# Patient Record
Sex: Male | Born: 1990 | Race: Black or African American | Hispanic: No | Marital: Single | Smoking: Never smoker
Health system: Southern US, Community
[De-identification: ages and names within clinical notes are randomized; demographics above are authoritative.]

## PROBLEM LIST (undated history)

## (undated) HISTORY — PX: APPENDECTOMY: SHX54

---

## 2015-06-11 ENCOUNTER — Emergency Department (HOSPITAL_COMMUNITY)
Admission: EM | Admit: 2015-06-11 | Discharge: 2015-06-11 | Disposition: A | Discharge status: Home / Self Care | Payer: Self-pay | Attending: Emergency Medicine | Admitting: Emergency Medicine

## 2015-06-11 ENCOUNTER — Encounter (HOSPITAL_COMMUNITY): Payer: Self-pay

## 2015-06-11 ENCOUNTER — Emergency Department (HOSPITAL_COMMUNITY): Payer: Self-pay

## 2015-06-11 DIAGNOSIS — R079 Chest pain, unspecified: Secondary | ICD-10-CM

## 2015-06-11 DIAGNOSIS — R0789 Other chest pain: Secondary | ICD-10-CM | POA: Insufficient documentation

## 2015-06-11 MED ORDER — NAPROXEN 500 MG PO TABS
500.0000 mg | ORAL_TABLET | Freq: Once | ORAL | Status: AC
  Administered 2015-06-11: 500 mg via ORAL
  Filled 2015-06-11: qty 1

## 2015-06-11 MED ORDER — NAPROXEN 500 MG PO TABS: 500.0000 mg | ORAL_TABLET | Freq: Two times a day (BID) | ORAL | Status: AC

## 2015-06-11 NOTE — ED Notes (Signed)
Pt fell off a roof two months ago but has been ok since then

## 2015-06-11 NOTE — ED Notes (Signed)
Pt complains of generalized weakness and a headache for two days, today he states that he has a stabbing pain in his chest

## 2015-06-11 NOTE — Discharge Instructions (Signed)
Be sure to drink plenty of fluids to prevent dehydration. Take naproxen as prescribed for pain. You may try icing areas of pain to improve inflammation/swelling which may be causing your pain. Follow-up with a primary care provider for further evaluation of your symptoms.  Chest Wall Pain Chest wall pain is pain in or around the bones and muscles of your chest. It may take up to 6 weeks to get better. It may take longer if you must stay physically active in your work and activities.  CAUSES  Chest wall pain may happen on its own. However, it may be caused by:  A viral illness like the flu.  Injury.  Coughing.  Exercise.  Arthritis.  Fibromyalgia.  Shingles. HOME CARE INSTRUCTIONS   Avoid overtiring physical activity. Try not to strain or perform activities that cause pain. This includes any activities using your chest or your abdominal and side muscles, especially if heavy weights are used.  Put ice on the sore area.  Put ice in a plastic bag.  Place a towel between your skin and the bag.  Leave the ice on for 15-20 minutes per hour while awake for the first 2 days.  Only take over-the-counter or prescription medicines for pain, discomfort, or fever as directed by your caregiver. SEEK IMMEDIATE MEDICAL CARE IF:   Your pain increases, or you are very uncomfortable.  You have a fever.  Your chest pain becomes worse.  You have new, unexplained symptoms.  You have nausea or vomiting.  You feel sweaty or lightheaded.  You have a cough with phlegm (sputum), or you cough up blood. MAKE SURE YOU:   Understand these instructions.  Will watch your condition.  Will get help right away if you are not doing well or get worse. Document Released: 11/21/2005 Document Revised: 02/13/2012 Document Reviewed: 07/18/2011 [facility] Patient Information 2015 [place], [place]. This information is not intended to replace advice given to you by your health care provider. Make sure you  discuss any questions you have with your health care provider.   Emergency Department Resource Guide 1) Find a Doctor and Pay Out of Pocket Although you won't have to find out who is covered by your insurance plan, it is a good idea to ask around and get recommendations. You will then need to call the office and see if the doctor you have chosen will accept you as a new patient and what types of options they offer for patients who are self-pay. Some doctors offer discounts or will set up payment plans for their patients who do not have insurance, but you will need to ask so you aren't surprised when you get to your appointment.  2) Contact Your Local Health Department Not all health departments have doctors that can see patients for sick visits, but many do, so it is worth a call to see if yours does. If you don't know where your local health department is, you can check in your phone book. The CDC also has a tool to help you locate your state's health department, and many state websites also have listings of all of their local health departments.  3) Find a Walk-in Clinic If your illness is not likely to be very severe or complicated, you may want to try a walk in clinic. These are popping up all over the country in pharmacies, drugstores, and shopping centers. They're usually staffed by nurse practitioners or physician assistants that have been trained to treat common illnesses and complaints. They're usually  fairly quick and inexpensive. However, if you have serious medical issues or chronic medical problems, these are probably not your best option.  No Primary Care Doctor: - Call Health Connect at  [phone] - they can help you locate a primary care doctor that  accepts your insurance, provides certain services, etc. - Physician Referral Service- [phone]  Chronic Pain Problems: Organization         Address  Phone   Notes  [facility]  [phone] Patients need to  be referred by their primary care doctor.   Medication Assistance: Organization         Address  Phone   Notes  [facility] Medication [facility] [address] [place] [place], [place] [address] [phone] --Must be a resident of [facility] -- Must have NO insurance coverage whatsoever (no Medicaid/ Medicare, etc.) -- The pt. MUST have a primary care doctor that directs their care regularly and follows them in the community   [facility]  [phone]   [employer]  [phone]    Agencies that provide inexpensive medical care: Organization         Address  Phone   Notes  [facility]  [phone]   [facility]    [phone]   [facility] [address] [place], [place] [address] [phone]   [facility] [address] [place] [phone]   Planned Parenthood    [phone]   [facility]    [phone]   [facility]  [address], [place] Phone:  [phone], Fax:  [phone] Hours of Operation:  9 am - 6 pm, M-F.  Also accepts Medicaid/Medicare and self-pay.  [facility]  [address], [place] Phone: [phone], Fax: [phone]. Hours of Operation:  8:30 am - 5:30 pm, M-F.  Also accepts Medicaid and self-pay.  [facility] [place] [address] [place] Phone: [phone]   [facility] [address] Natasha Bence [place], [place] [phone], Ext. [phone] Mondays & Thursdays: 7-9 AM.  First 15 patients are seen on a first come, first serve basis.    Medicaid-accepting [facility] Providers:  Organization         Address  Phone   Notes  [facility] [address], [place] [phone] Also accepts self-pay patients.  [facility] [address] Laurell Josephs [place], [place]  [phone]   [facility] [address], [place] [phone]   [facility] [address] [place] [phone]   Renaye Rakers [address], [place]   [phone] Only accepts [place] patients after they have their name applied to their card.   Self-Pay (no insurance) in [facility]:  Organization         Address  Phone   Notes  Sickle Cell Patients, [facility] [address] [place], [place] [phone]   [facility] [address] [place], [place] [phone]   [facility] [place]  [address], [place] [phone]   [facility]/Dr. Osei-Bonsu  [address] [place] or [address], [place] [phone] Phone  number for both [place] and [place] locations is the same.  [facility] [address], [place] [phone]   [facility] [place] [address] [place] [address], [place] or [address] [phone]   [facility] [address], [place] [phone], phone; [phone], fax Sees patients 1st and 3rd Saturday of every month.  Must not qualify for public or private insurance (i.e. Medicaid, Medicare, [place] Health Choice, Veterans' Benefits)  Household income should be no more than 200% of the poverty level The clinic cannot treat you if you are pregnant or think you are pregnant  Sexually transmitted diseases are not treated at the clinic.    Dental Care: Organization         Address  Phone  Notes  [facility] [address], [place] [phone] Accepts children up to age 24 who are enrolled in [place] or [place] Health Choice; pregnant women with a Medicaid card; and children who have applied for Medicaid or [place] Health Choice, but were declined, whose parents can  pay a reduced fee at time of service.  [facility] [place]  [address], [place] [phone] Accepts children up to age 24 who are enrolled in [place] or [place] Health Choice; pregnant women with a Medicaid card; and children who have applied for Medicaid or [place] Health Choice, but were declined, whose parents can pay a reduced fee at time of service.  [facility]  [address], [place] [phone] Patients are seen by appointment only. Walk-ins are not accepted. [facility] will see patients 24 years of age and older. Monday - Tuesday (8am-5pm) Most Wednesdays (8:30-5pm) $30 per visit, cash only  [facility]  [address], [facility] [phone] Patients are seen by appointment only. Walk-ins are not accepted. [facility] will see patients 24 years of age and older. One Wednesday Evening (Monthly: Volunteer Based).  $30 per visit, cash only  [employer] [facility] [facility]  [phone] for adults; Children under age 134, call Graduate Pediatric Dentistry at [phone]. Children aged 124-14, please call [phone] to request a pediatric application.  Dental services are provided in all areas of dental care including fillings, crowns and bridges, complete and partial dentures, implants, gum treatment, root canals, and extractions. Preventive care is also provided. Treatment is provided to both adults and children. Patients are selected via a lottery and there is often a waiting list.   [facility] [address], [place]  [phone] [email or web address]   [facility] [address], [place], [place] [phone], Ext. [phone] Second and Fourth Thursday of each month, opens at 6:30 AM; Clinic ends at 9 AM.  Patients are seen on a first-come first-served basis, and a limited number are seen during each clinic.   [facility]  [address], [place], [place] [phone]   Eligibility Requirements You must have lived in [place], [place], or [place] counties for at least the last three months.   You cannot be eligible for state or federal sponsored [place]healthcare insurance, including CIGNAVeterans Administration, [place]icaid, or [employer]icare.   You generally cannot be eligible for healthcare insurance through your employer.    How to apply: Eligibility screenings are held every Tuesday and Wednesday afternoon from 1:00 pm  until 4:00 pm. You do not need an appointment for the interview!  [facility] [address], [place], [place] [phone]   [facility] Health Department  [phone]   [facility]  [phone]   [facility] Health Department  [phone]

## 2015-06-11 NOTE — ED Provider Notes (Signed)
CSN: [id number]     Arrival date & time 06/11/15  0015 History   First MD Initiated Contact with Patient 06/11/15 0057     Chief Complaint  Patient presents with  . Chest Pain     (Consider location/radiation/quality/duration/timing/severity/associated sxs/prior Treatment) HPI Comments: 24 year old male presents to the emergency department for further evaluation of chest pain. Patient states that pain began at 2100 while lying down on his chest. He describes the pain as sharp and intermittent. It is aggravated with movement of his left arm as well as when lying on his chest. Patient felt the pain moved to his right low back for a few seconds, but he no longer has pain in this location. He denies taking any medication for his symptoms as well as any alleviating factors of his pain. Patient denies a family history of sudden cardiac death. He believes that he has a paternal grand uncle who had a heart attack in his 4950s to 4960s. Patient, himself, denies any known history of CAD, hypertension, hyperlipidemia, and diabetes mellitus. He has had no associated sore throat, fever, vomiting, or loss of consciousness associated with his symptoms. As an aside, patient mentions a fall off of a roof 3 months ago. He denies having any pain after the fall or any consistent symptoms associated with this trauma over the past 3 months. He denies the fall being the result of syncope; he states that he lost his balance causing him to fall off the roof. 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  Patient is a 24 y.o. male presenting with chest pain. The history is provided by the patient. No language interpreter was used.  Chest Pain   History reviewed. No pertinent past medical history. History reviewed. No pertinent past surgical history. History reviewed. No pertinent family history. History  Substance Use Topics  . Smoking status: Never Smoker    . Smokeless tobacco: Not on file  . Alcohol Use: No    Review of Systems  Cardiovascular: Positive for chest pain.  All other systems reviewed and are negative.   Allergies  Review of patient's allergies indicates no known allergies.  Home Medications   Prior to Admission medications   Medication Sig Start Date End Date Taking? Authorizing Provider  Aspirin-Acetaminophen-Caffeine 260-130-16 MG TABS Take 1 packet by mouth every 4 (four) hours as needed (pain).   Yes Historical Provider, MD  naproxen (NAPROSYN) 500 MG tablet Take 1 tablet (500 mg total) by mouth 2 (two) times daily. 06/11/15   Antony MaduraKelly Edge Mauger, PA-C   BP 130/73 mmHg  Pulse 67  Temp(Src) 97.9 F (36.6 C) (Oral)  Resp 18  Ht 5\' 11"  (1.803 m)  Wt 226 lb (102.513 kg)  BMI 31.53 kg/m2  SpO2 99%   Physical Exam  Constitutional: He is oriented to person, place, and time. He appears well-developed and well-nourished. No distress.  Nontoxic/nonseptic appearing  HENT:  Head: Normocephalic and atraumatic.  Eyes: Conjunctivae and EOM are normal. No scleral icterus.  Neck: Normal range of motion.  Cardiovascular: Normal rate, regular rhythm and intact distal pulses.   Pulmonary/Chest: Effort normal and breath sounds normal. No respiratory distress. He has no wheezes. He has no rales. He exhibits tenderness (Tenderness to palpation to the left anterior chest wall).  Respirations even and unlabored. No bony deformity or crepitus. Chest expansion symmetric  Musculoskeletal: Normal range of motion.  Neurological: He is alert  and oriented to person, place, and time. He exhibits normal muscle tone. Coordination normal.  GCS 15. Speech is goal oriented. Patient moves extremities without ataxia.  Skin: Skin is warm and dry. No rash noted. He is not diaphoretic. No erythema. No pallor.  Psychiatric: He has a normal mood and affect. His behavior is normal.  Nursing note and vitals reviewed.   ED Course  Procedures (including  critical care time) Labs Review Labs Reviewed - No data to display  Imaging Review Dg Chest 2 View  06/11/2015   CLINICAL DATA:  Chest pain  EXAM: CHEST  2 VIEW  COMPARISON:  None.  FINDINGS: Normal heart size and mediastinal contours. No acute infiltrate or edema. No effusion or pneumothorax. No acute osseous findings.  IMPRESSION: Negative chest.   Electronically Signed   By: Marnee Spring M.D.   On: 06/11/2015 01:19    ED ECG REPORT   Date: 06/11/2015  Rate: 73  Rhythm: normal sinus rhythm  QRS Axis: normal  Intervals: normal  ST/T Wave abnormalities: nonspecific ST changes  Conduction Disutrbances:none  Narrative Interpretation: NSR with no STEMI or ischemic change. NSST changes c/w early repolarization  Old EKG Reviewed: none available  I have personally reviewed the EKG tracing and agree with the computerized printout as noted.   MDM   Final diagnoses:  Chest wall pain    24 year old male presents to the emergency department for further evaluation of chest pain. Chest x-ray shows no pneumothorax, rib fracture, focal consolidation, or pneumonia. EKG is reassuring, with no ischemic change or STEMI. Doubt ACS given age, lack of risk factors, atypical nature of symptoms, and reassuring EKG. Symptoms are reproducible on palpation making MSK etiology most likely. We will manage symptoms as outpatient with NSAIDs. Have advised icing and that patient refrain from heavy lifting as he does much of this occupationally. Return precautions provided. Patient discharged in good condition with no unaddressed concerns.   Filed Vitals:   06/11/15 0022 06/11/15 0143 06/11/15 0223  BP: 141/79 128/69 130/73  Pulse: 70 72 67  Temp: 97.9 F (36.6 C)    TempSrc: Oral    Resp: Height:  (1.803 m)    Weight: 226 lb (102.513 kg)    SpO2: 97% 99% 99%     Antony Madura, PA-C 06/11/15 0331  Loren Racer, MD 06/11/15 [phone]

## 2015-06-24 ENCOUNTER — Encounter (HOSPITAL_COMMUNITY): Payer: Self-pay | Admitting: *Deleted

## 2015-06-24 ENCOUNTER — Emergency Department (HOSPITAL_COMMUNITY): Payer: Self-pay

## 2015-06-24 ENCOUNTER — Emergency Department (HOSPITAL_COMMUNITY)
Admission: EM | Admit: 2015-06-24 | Discharge: 2015-06-25 | Disposition: A | Discharge status: Home / Self Care | Payer: Self-pay | Attending: Emergency Medicine | Admitting: Emergency Medicine

## 2015-06-24 DIAGNOSIS — R1084 Generalized abdominal pain: Secondary | ICD-10-CM | POA: Insufficient documentation

## 2015-06-24 DIAGNOSIS — R197 Diarrhea, unspecified: Secondary | ICD-10-CM | POA: Insufficient documentation

## 2015-06-24 DIAGNOSIS — R112 Nausea with vomiting, unspecified: Secondary | ICD-10-CM | POA: Insufficient documentation

## 2015-06-24 LAB — COMPREHENSIVE METABOLIC PANEL
ALK PHOS: 75 U/L (ref 38–126)
ALT: 28 U/L (ref 17–63)
AST: 20 U/L (ref 15–41)
Albumin: 4.2 g/dL (ref 3.5–5.0)
Anion gap: 7 (ref 5–15)
BUN: 16 mg/dL (ref 6–20)
CALCIUM: 9.3 mg/dL (ref 8.9–10.3)
CHLORIDE: 104 mmol/L (ref 101–111)
CO2: 28 mmol/L (ref 22–32)
Creatinine, Ser: 1.14 mg/dL (ref 0.61–1.24)
Glucose, Bld: 85 mg/dL (ref 65–99)
POTASSIUM: 3.8 mmol/L (ref 3.5–5.1)
SODIUM: 139 mmol/L (ref 135–145)
Total Bilirubin: 0.5 mg/dL (ref 0.3–1.2)
Total Protein: 8.5 g/dL — ABNORMAL HIGH (ref 6.5–8.1)

## 2015-06-24 LAB — URINALYSIS, ROUTINE W REFLEX MICROSCOPIC
BILIRUBIN URINE: NEGATIVE
Glucose, UA: NEGATIVE mg/dL
HGB URINE DIPSTICK: NEGATIVE
KETONES UR: NEGATIVE mg/dL
Leukocytes, UA: NEGATIVE
Nitrite: NEGATIVE
PH: 5.5 (ref 5.0–8.0)
PROTEIN: NEGATIVE mg/dL
Specific Gravity, Urine: 1.034 — ABNORMAL HIGH (ref 1.005–1.030)
UROBILINOGEN UA: 1 mg/dL (ref 0.0–1.0)

## 2015-06-24 LAB — CBC
HCT: 44.5 % (ref 39.0–52.0)
Hemoglobin: 15.3 g/dL (ref 13.0–17.0)
MCH: 28.1 pg (ref 26.0–34.0)
MCHC: 34.4 g/dL (ref 30.0–36.0)
MCV: 81.8 fL (ref 78.0–100.0)
Platelets: 242 10*3/uL (ref 150–400)
RBC: 5.44 MIL/uL (ref 4.22–5.81)
RDW: 12.4 % (ref 11.5–15.5)
WBC: 8.5 10*3/uL (ref 4.0–10.5)

## 2015-06-24 LAB — LIPASE, BLOOD: LIPASE: 28 U/L (ref 22–51)

## 2015-06-24 MED ORDER — ONDANSETRON HCL 4 MG/2ML IJ SOLN
4.0000 mg | Freq: Once | INTRAMUSCULAR | Status: AC
  Administered 2015-06-24: 4 mg via INTRAVENOUS
  Filled 2015-06-24: qty 2

## 2015-06-24 MED ORDER — FENTANYL CITRATE (PF) 100 MCG/2ML IJ SOLN
100.0000 ug | Freq: Once | INTRAMUSCULAR | Status: AC
Start: 2015-06-24 — End: 2015-06-24
  Administered 2015-06-24: 100 ug via INTRAVENOUS
  Filled 2015-06-24: qty 2

## 2015-06-24 NOTE — ED Notes (Signed)
Pt states he has had abdominal pain, nausea, diarrhea. emesis and left lower back pain for the past 3 days. Pt states he threw up at 9PM today after eating.

## 2015-06-25 ENCOUNTER — Encounter (HOSPITAL_COMMUNITY): Payer: Self-pay

## 2015-06-25 MED ORDER — IOHEXOL 300 MG/ML  SOLN
100.0000 mL | Freq: Once | INTRAMUSCULAR | Status: AC | PRN
  Administered 2015-06-25: 100 mL via INTRAVENOUS

## 2015-06-25 MED ORDER — HYDROCODONE-ACETAMINOPHEN 5-325 MG PO TABS: 1.0000 | ORAL_TABLET | Freq: Four times a day (QID) | ORAL | Status: AC | PRN

## 2015-06-25 MED ORDER — SODIUM CHLORIDE 0.9 % IV BOLUS (SEPSIS)
1000.0000 mL | Freq: Once | INTRAVENOUS | Status: AC
  Administered 2015-06-25: 1000 mL via INTRAVENOUS

## 2015-06-25 NOTE — ED Provider Notes (Signed)
0981 - Patient care assumed from [facility], PA-C at shift change. Patient presenting to the emergency department for further evaluation of abdominal pain. Plan discussed with lawyer, PA-C which includes discharge if CT imaging is negative. CT findings reviewed. There is no evidence of acute intra-abdominal or pelvic pathology. Will discharge with prescription for Vicodin, prescribed by prior provider. Return precautions given. Patient discharged in good condition.   Filed Vitals:   06/24/15 2215 06/25/15 0000 06/25/15 0013  BP: 125/72 125/75 125/75  Pulse: 77 74 75  Temp: 98.5 F (36.9 C)    TempSrc: Oral    Resp: 20  18  Height: 5\' 11"  (1.803 m)    Weight: 224 lb (101.606 kg)    SpO2: 98% 98% 100%    Results for orders placed or performed during the hospital encounter of 06/24/15  Lipase, blood  Result Value Ref Range   Lipase 28 22 - 51 U/L  Comprehensive metabolic panel  Result Value Ref Range   Sodium 139 135 - 145 mmol/L   Potassium 3.8 3.5 - 5.1 mmol/L   Chloride 104 101 - 111 mmol/L   CO2 28 22 - 32 mmol/L   Glucose, Bld 85 65 - 99 mg/dL   BUN 16 6 - 20 mg/dL   Creatinine, Ser 1.91 0.61 - 1.24 mg/dL   Calcium 9.3 8.9 - 47.8 mg/dL   Total Protein 8.5 (H) 6.5 - 8.1 g/dL   Albumin 4.2 3.5 - 5.0 g/dL   AST 20 15 - 41 U/L   ALT 28 17 - 63 U/L   Alkaline Phosphatase 75 38 - 126 U/L   Total Bilirubin 0.5 0.3 - 1.2 mg/dL   GFR calc non Af Amer >60 >60 mL/min   GFR calc Af Amer >60 >60 mL/min   Anion gap 7 5 - 15  CBC  Result Value Ref Range   WBC 8.5 4.0 - 10.5 K/uL   RBC 5.44 4.22 - 5.81 MIL/uL   Hemoglobin 15.3 13.0 - 17.0 g/dL   HCT 29.5 62.1 - 30.8 %   MCV 81.8 78.0 - 100.0 fL   MCH 28.1 26.0 - 34.0 pg   MCHC 34.4 30.0 - 36.0 g/dL   RDW 65.7 84.6 - 96.2 %   Platelets 242 150 - 400 K/uL  Urinalysis, Routine w reflex microscopic (not at [facility])  Result Value Ref Range   Color, Urine YELLOW YELLOW   APPearance CLEAR CLEAR   Specific Gravity, Urine 1.034  (H) 1.005 - 1.030   pH 5.5 5.0 - 8.0   Glucose, UA NEGATIVE NEGATIVE mg/dL   Hgb urine dipstick NEGATIVE NEGATIVE   Bilirubin Urine NEGATIVE NEGATIVE   Ketones, ur NEGATIVE NEGATIVE mg/dL   Protein, ur NEGATIVE NEGATIVE mg/dL   Urobilinogen, UA 1.0 0.0 - 1.0 mg/dL   Nitrite NEGATIVE NEGATIVE   Leukocytes, UA NEGATIVE NEGATIVE   Dg Chest 2 View  06/11/2015   CLINICAL DATA:  Chest pain  EXAM: CHEST  2 VIEW  COMPARISON:  None.  FINDINGS: Normal heart size and mediastinal contours. No acute infiltrate or edema. No effusion or pneumothorax. No acute osseous findings.  IMPRESSION: Negative chest.   Electronically Signed   By: Marnee Spring M.D.   On: 06/11/2015 01:19   Ct Abdomen Pelvis W Contrast  06/25/2015   CLINICAL DATA:  24 year old male with abdominal pain and lower back pain.  EXAM: CT ABDOMEN AND PELVIS WITH CONTRAST  TECHNIQUE: Multidetector CT imaging of the abdomen and pelvis was performed using  the standard protocol following bolus administration of intravenous contrast.  CONTRAST:  OMNIPAQUE IOHEXOL 300 MG/ML  SOLN  COMPARISON:  None.  FINDINGS: The visualized lung bases are clear. No intra-abdominal free air or free fluid.  The liver, gallbladder, pancreas, spleen, adrenal glands, kidneys, visualized ureters, and urinary bladder appear unremarkable. The prostate and seminal vesicles are grossly unremarkable.  There is no evidence of bowel obstruction or inflammation. The appendix is not visualized and may be surgically absent. No inflammatory changes noted in the right lower quadrant.  The visualized abdominal aorta and IVC appear patent. No portal venous gas identified. Top-normal retroperitoneal and para-aortic lymph nodes, nonspecific. The No adenopathy.  Bone fat containing umbilical hernia. The osseous structures appear unremarkable.  IMPRESSION: No acute intra-abdominal or pelvic pathology.   Electronically Signed   By: Elgie Collard M.D.   On: 06/25/2015 02:40      Antony Madura, PA-C 06/25/15 1610  Mirian Mo, MD 06/25/15 [phone]

## 2015-06-25 NOTE — ED Provider Notes (Signed)
CSN: [id number]     Arrival date & time 06/24/15  2123 History   First MD Initiated Contact with Patient 06/24/15 2227     Chief Complaint  Patient presents with  . Back Pain  . Abdominal Pain  . Emesis     (Consider location/radiation/quality/duration/timing/severity/associated sxs/prior Treatment) HPI Patient presents to the emergency department with left-sided abdominal pain that radiates to his back.  Patient states that he has generalized abdominal pain that tends to locate more the left side of his abdomen with radiation to the left lower back.  The patient states this been ongoing for quite a while.  He has had some nausea, vomiting and diarrhea over the last 3 days.  The patient states that he threw up earlier this evening after eating some food.  The patient denies chest pain, shortness of breath, weakness, dizziness, headache, blurred vision, fever, cough, runny nose, sore throat, dysuria, incontinence, hematuria, bloody stool, anorexia, rash, near syncope, lightheadedness or syncope.  The patient states that movement and palpation make the pain worse.  Patient states he did not take any medications prior to arrival for her symptoms History reviewed. No pertinent past medical history. History reviewed. No pertinent past surgical history. No family history on file. History  Substance Use Topics  . Smoking status: Never Smoker   . Smokeless tobacco: Not on file  . Alcohol Use: No    Review of Systems  All other systems negative except as documented in the HPI. All pertinent positives and negatives as reviewed in the HPI.  Allergies  Review of patient's allergies indicates no known allergies.  Home Medications   Prior to Admission medications   Medication Sig Start Date End Date Taking? Authorizing Provider  naproxen (NAPROSYN) 500 MG tablet Take 1 tablet (500 mg total) by mouth 2 (two) times daily. Patient not taking: Reported on 06/24/2015 06/11/15   Antony Madura, PA-C   BP  125/75 mmHg  Pulse 74  Temp(Src) 98.5 F (36.9 C) (Oral)  Resp 20  Ht  (1.803 m)  Wt 224 lb (101.606 kg)  BMI 31.26 kg/m2  SpO2 98% Physical Exam  Constitutional: He is oriented to person, place, and time. He appears well-developed and well-nourished. No distress.  HENT:  Head: Normocephalic and atraumatic.  Mouth/Throat: Oropharynx is clear and moist.  Eyes: Pupils are equal, round, and reactive to light.  Neck: Normal range of motion. Neck supple.  Cardiovascular: Normal rate, regular rhythm and normal heart sounds.  Exam reveals no gallop and no friction rub.   No murmur heard. Pulmonary/Chest: Effort normal and breath sounds normal. No respiratory distress. He has no wheezes.  Abdominal: Soft. Normal appearance and bowel sounds are normal. He exhibits no distension. There is tenderness. There is no rebound and no guarding.    Musculoskeletal: He exhibits no edema.  Neurological: He is alert and oriented to person, place, and time. He exhibits normal muscle tone. Coordination normal.  Skin: Skin is warm and dry. No rash noted. No erythema.  Psychiatric: He has a normal mood and affect. His behavior is normal.  Nursing note and vitals reviewed.   ED Course  Procedures (including critical care time) Labs Review Labs Reviewed  COMPREHENSIVE METABOLIC PANEL - Abnormal; Notable for the following:    Total Protein 8.5 (*)    All other components within normal limits  URINALYSIS, ROUTINE W REFLEX MICROSCOPIC (NOT AT [facility]) - Abnormal; Notable for the following:    Specific Gravity, Urine 1.034 (*)  All other components within normal limits  LIPASE, BLOOD  CBC    Imaging Review No results found. Advised patient that we will assess blood work along with a CT scan.  Patient is given the plan and all questions were answered.  Charlestine Night, PA-C 06/25/15 6045  Lorre Nick, MD 06/30/15 1018

## 2015-06-25 NOTE — Discharge Instructions (Signed)
Return here as needed.  Follow-up with the GI Dr. provided °

## 2015-07-15 ENCOUNTER — Emergency Department (HOSPITAL_COMMUNITY)
Admission: EM | Admit: 2015-07-15 | Discharge: 2015-07-15 | Disposition: A | Discharge status: Home / Self Care | Payer: Self-pay | Attending: Emergency Medicine | Admitting: Emergency Medicine

## 2015-07-15 ENCOUNTER — Encounter (HOSPITAL_COMMUNITY): Payer: Self-pay | Admitting: Emergency Medicine

## 2015-07-15 ENCOUNTER — Emergency Department (HOSPITAL_COMMUNITY): Payer: Self-pay

## 2015-07-15 DIAGNOSIS — R1110 Vomiting, unspecified: Secondary | ICD-10-CM | POA: Insufficient documentation

## 2015-07-15 DIAGNOSIS — R1011 Right upper quadrant pain: Secondary | ICD-10-CM | POA: Insufficient documentation

## 2015-07-15 DIAGNOSIS — R197 Diarrhea, unspecified: Secondary | ICD-10-CM | POA: Insufficient documentation

## 2015-07-15 DIAGNOSIS — R109 Unspecified abdominal pain: Secondary | ICD-10-CM

## 2015-07-15 LAB — URINALYSIS, ROUTINE W REFLEX MICROSCOPIC
Bilirubin Urine: NEGATIVE
GLUCOSE, UA: NEGATIVE mg/dL
Hgb urine dipstick: NEGATIVE
KETONES UR: NEGATIVE mg/dL
Nitrite: NEGATIVE
PH: 6.5 (ref 5.0–8.0)
Protein, ur: NEGATIVE mg/dL
Specific Gravity, Urine: 1.023 (ref 1.005–1.030)
Urobilinogen, UA: 1 mg/dL (ref 0.0–1.0)

## 2015-07-15 LAB — COMPREHENSIVE METABOLIC PANEL
ALT: 27 U/L (ref 17–63)
ANION GAP: 8 (ref 5–15)
AST: 24 U/L (ref 15–41)
Albumin: 4.2 g/dL (ref 3.5–5.0)
Alkaline Phosphatase: 68 U/L (ref 38–126)
BUN: 18 mg/dL (ref 6–20)
CO2: 29 mmol/L (ref 22–32)
Calcium: 9.5 mg/dL (ref 8.9–10.3)
Chloride: 101 mmol/L (ref 101–111)
Creatinine, Ser: 1.09 mg/dL (ref 0.61–1.24)
GFR calc Af Amer: >60 mL/min (ref >60)
Glucose, Bld: 107 mg/dL — ABNORMAL HIGH (ref 65–99)
POTASSIUM: 4 mmol/L (ref 3.5–5.1)
SODIUM: 138 mmol/L (ref 135–145)
Total Bilirubin: 0.5 mg/dL (ref 0.3–1.2)
Total Protein: 8.3 g/dL — ABNORMAL HIGH (ref 6.5–8.1)

## 2015-07-15 LAB — CBC
HCT: 43 % (ref 39.0–52.0)
HEMOGLOBIN: 14.7 g/dL (ref 13.0–17.0)
MCH: 28.1 pg (ref 26.0–34.0)
MCHC: 34.2 g/dL (ref 30.0–36.0)
MCV: 82.1 fL (ref 78.0–100.0)
Platelets: 242 10*3/uL (ref 150–400)
RBC: 5.24 MIL/uL (ref 4.22–5.81)
RDW: 12.5 % (ref 11.5–15.5)
WBC: 7.2 10*3/uL (ref 4.0–10.5)

## 2015-07-15 LAB — URINE MICROSCOPIC-ADD ON

## 2015-07-15 LAB — LIPASE, BLOOD: LIPASE: 28 U/L (ref 22–51)

## 2015-07-15 MED ORDER — SUCRALFATE 1 G PO TABS
1.0000 g | ORAL_TABLET | Freq: Once | ORAL | Status: AC
  Administered 2015-07-15: 1 g via ORAL
  Filled 2015-07-15: qty 1

## 2015-07-15 MED ORDER — ONDANSETRON HCL 4 MG/2ML IJ SOLN
4.0000 mg | Freq: Once | INTRAMUSCULAR | Status: AC
  Administered 2015-07-15: 4 mg via INTRAVENOUS
  Filled 2015-07-15: qty 2

## 2015-07-15 MED ORDER — OMEPRAZOLE 20 MG PO CPDR: 20.0000 mg | DELAYED_RELEASE_CAPSULE | Freq: Every day | ORAL | Status: AC

## 2015-07-15 MED ORDER — DICYCLOMINE HCL 10 MG PO CAPS
10.0000 mg | ORAL_CAPSULE | Freq: Once | ORAL | Status: AC
  Administered 2015-07-15: 10 mg via ORAL
  Filled 2015-07-15: qty 1

## 2015-07-15 MED ORDER — SUCRALFATE 1 GM/10ML PO SUSP: 1.0000 g | Freq: Three times a day (TID) | ORAL | Status: AC

## 2015-07-15 MED ORDER — MORPHINE SULFATE 4 MG/ML IJ SOLN
4.0000 mg | Freq: Once | INTRAMUSCULAR | Status: AC
  Administered 2015-07-15: 4 mg via INTRAVENOUS
  Filled 2015-07-15: qty 1

## 2015-07-15 MED ORDER — DICYCLOMINE HCL 20 MG PO TABS: 20.0000 mg | ORAL_TABLET | Freq: Two times a day (BID) | ORAL | Status: AC

## 2015-07-15 MED ORDER — SODIUM CHLORIDE 0.9 % IV BOLUS (SEPSIS)
500.0000 mL | Freq: Once | INTRAVENOUS | Status: AC
  Administered 2015-07-15: 500 mL via INTRAVENOUS

## 2015-07-15 NOTE — Progress Notes (Signed)
Patient listed as having no insurance or pcp.  EDCM spoke topatient at bedside.  Patient confirms he lives in [place].  EDCM provided patient with list of frr/income clinics in [facility], contact information for DSS and DHHS in [place], and financial resources in [place].  Patient thankful for resources.  No further EDCm needs at this time.

## 2015-07-15 NOTE — ED Provider Notes (Addendum)
CSN: [id number]     Arrival date & time 07/15/15  1920 History   First MD Initiated Contact with Patient 07/15/15 2041     Chief Complaint  Patient presents with  . Abdominal Pain     (Consider location/radiation/quality/duration/timing/severity/associated sxs/prior Treatment) HPI    PCP: No primary care provider on file. Blood pressure 148/91, pulse 101, temperature 98 F (36.7 C), temperature source Oral, resp. rate 16, SpO2 100 %.  Thomas Hampton is a 24 y.o.male with a significant PMH of back pain, abdominal pain, emesis presents to the ER with complaints of abdominal pain that radiates into his back.  The symptoms started yesterday after eating a cheeseburger at [employer]. He reports having diarrhea while still at the restaurant, and before he could leave the restroom he started vomiting. Since then he has been having intermittent right upper abdominal pain that radiates across the top of his abdomen to his left upper quadrant abdomen. He has hx of appendectomy. He was seen for similar complaints last month, had a CT scan which was normal, was given Rx for Vicodin and discharged.  The patient denies diaphoresis, fever, headache, weakness (general or focal), confusion, change of vision,  neck pain, dysphagia, aphagia, chest pain, shortness of breath,  back pain, lower extremity swelling, rash   History reviewed. No pertinent past medical history. Past Surgical History  Procedure Laterality Date  . Appendectomy     History reviewed. No pertinent family history. Social History  Substance Use Topics  . Smoking status: Never Smoker   . Smokeless tobacco: Current User    Types: Chew  . Alcohol Use: No    Review of Systems  10 Systems reviewed and are negative for acute change except as noted in the HPI.   Allergies  Review of patient's allergies indicates no known allergies.  Home Medications   Prior to Admission medications   Medication Sig Start Date End Date Taking?  Authorizing Provider  HYDROcodone-acetaminophen (NORCO/VICODIN) 5-325 MG per tablet Take 1 tablet by mouth every 6 (six) hours as needed for moderate pain. Patient not taking: Reported on 07/15/2015 06/25/15   Charlestine Night, PA-C  naproxen (NAPROSYN) 500 MG tablet Take 1 tablet (500 mg total) by mouth 2 (two) times daily. Patient not taking: Reported on 06/24/2015 06/11/15   Antony Madura, PA-C   BP 148/91 mmHg  Pulse 101  Temp(Src) 98 F (36.7 C) (Oral)  Resp 16  SpO2 100% Physical Exam  Constitutional: He appears well-developed and well-nourished. No distress.  HENT:  Head: Normocephalic and atraumatic.  Eyes: Pupils are equal, round, and reactive to light.  Neck: Normal range of motion. Neck supple.  Cardiovascular: Normal rate and regular rhythm.   Pulmonary/Chest: Effort normal.  Abdominal: Soft. Bowel sounds are normal. There is tenderness in the right upper quadrant. There is no rigidity, no rebound, no guarding, no CVA tenderness and negative Murphy's sign.  Neurological: He is alert.  Skin: Skin is warm and dry.  Nursing note and vitals reviewed.   ED Course  Procedures (including critical care time) Labs Review Labs Reviewed  COMPREHENSIVE METABOLIC PANEL - Abnormal; Notable for the following:    Glucose, Bld 107 (*)    Total Protein 8.3 (*)    All other components within normal limits  URINALYSIS, ROUTINE W REFLEX MICROSCOPIC (NOT AT [facility]) - Abnormal; Notable for the following:    Leukocytes, UA SMALL (*)    All other components within normal limits  URINE CULTURE  LIPASE, BLOOD  CBC  URINE MICROSCOPIC-ADD ON  GC/CHLAMYDIA PROBE AMP ([place]) NOT AT [facility]    Imaging Review US Abdomen Limited  07/15/2015   CLINICAL DATA:  Upper abdominal pain with nausea and vomiting for 1 day  EXAM: US ABDOMEN LIMITED - RIGHT UPPER QUADRANT  COMPARISON:  CT abdomen and pelvis June 25, 2015  FINDINGS: Gallbladder:  The gallbladder is contracted consistent with post-prandial  state. No gallstones are seen on this study. There is no gallbladder wall edema or pericholecystic fluid. No sonographic Murphy sign noted.  Common bile duct:  Diameter: 3 mm. There is no intrahepatic or extrahepatic biliary duct dilatation.  Liver:  No focal lesion identified.  Liver echogenicity is increased.  IMPRESSION: Gallbladder is somewhat contracted consistent with post-prandial state. No gallbladder pathology is demonstrated on this study. If there remains concern for potential gallbladder pathology, repeat study after minimum of 8 hr fasting would be advised.  Liver echogenicity is increased. This finding most likely is indicative of a degree of underlying hepatic steatosis. While no focal liver lesions are identified, it must be cautioned that the sensitivity of ultrasound for focal liver lesions is diminished in this circumstance.   Electronically Signed   By: Bretta Bang III M.D.   On: 07/15/2015 21:59     EKG Interpretation None      MDM   Final diagnoses:  Abdominal pain, unspecified abdominal location  Vomiting and diarrhea    Patient has a small amount of leukocytes and 11-20 WBC but no bacteria. Urine culture sent out as well as GC added on to the urine. Lipase, CMP and CBC are unremarkable. US abdomen shows no emergency findings that would require further work-up or evaluation at this time.  Patients abdomen re-evaluate prior to discharge and it is soft, non-tender with no peritoneal signs. Pt comfortable with plan. Will give him rx for prilosec, Bentyl and carafate. Referral to GI.   Medications  sodium chloride 0.9 % bolus 500 mL (500 mLs Intravenous New Bag/Given 07/15/15 2148)  morphine 4 MG/ML injection 4 mg (4 mg Intravenous Given 07/15/15 2148)  ondansetron (ZOFRAN) injection 4 mg (4 mg Intravenous Given 07/15/15 2148)    24 y.o.Thomas Hampton's evaluation in the Emergency Department is complete. It has been determined that no acute conditions requiring  further emergency intervention are present at this time. The patient/guardian have been advised of the diagnosis and plan. We have discussed signs and symptoms that warrant return to the ED, such as changes or worsening in symptoms.  Vital signs are stable at discharge. Filed Vitals:   07/15/15 1946  BP: 148/91  Pulse: 101  Temp: 98 F (36.7 C)  Resp: 16    Patient/guardian has voiced understanding and agreed to follow-up with the PCP or specialist.    Marlon Pel, PA-C 07/15/15 2304  Melene Plan, DO 07/15/15 2306  Marlon Pel, PA-C 07/15/15 2314  Melene Plan, DO 07/15/15 2317

## 2015-07-15 NOTE — Discharge Instructions (Signed)
Abdominal Pain °Many things can cause abdominal pain. Usually, abdominal pain is not caused by a disease and will improve without treatment. It can often be observed and treated at home. Your health care provider will do a physical exam and possibly order blood tests and X-rays to help determine the seriousness of your pain. However, in many cases, more time must pass before a clear cause of the pain can be found. Before that point, your health care provider may not know if you need more testing or further treatment. °HOME CARE INSTRUCTIONS  °Monitor your abdominal pain for any changes. The following actions may help to alleviate any discomfort you are experiencing: °· Only take over-the-counter or prescription medicines as directed by your health care provider. °· Do not take laxatives unless directed to do so by your health care provider. °· Try a clear liquid diet (broth, tea, or water) as directed by your health care provider. Slowly move to a bland diet as tolerated. °SEEK MEDICAL CARE IF: °· You have unexplained abdominal pain. °· You have abdominal pain associated with nausea or diarrhea. °· You have pain when you urinate or have a bowel movement. °· You experience abdominal pain that wakes you in the night. °· You have abdominal pain that is worsened or improved by eating food. °· You have abdominal pain that is worsened with eating fatty foods. °· You have a fever. °SEEK IMMEDIATE MEDICAL CARE IF:  °· Your pain does not go away within 2 hours. °· You keep throwing up (vomiting). °· Your pain is felt only in portions of the abdomen, such as the right side or the left lower portion of the abdomen. °· You pass bloody or black tarry stools. °MAKE SURE YOU: °· Understand these instructions.   °· Will watch your condition.   °· Will get help right away if you are not doing well or get worse.   °Document Released: 08/31/2005 Document Revised: 11/26/2013 Document Reviewed: 07/31/2013 °ExitCare® Patient Information  ©2015 [employer]. This information is not intended to replace advice given to you by your health care provider. Make sure you discuss any questions you have with your health care provider. ° °Gastritis, Adult °Gastritis is soreness and swelling (inflammation) of the lining of the stomach. Gastritis can develop as a sudden onset (acute) or long-term (chronic) condition. If gastritis is not treated, it can lead to stomach bleeding and ulcers. °CAUSES  °Gastritis occurs when the stomach lining is weak or damaged. Digestive juices from the stomach then inflame the weakened stomach lining. The stomach lining may be weak or damaged due to viral or bacterial infections. One common bacterial infection is the Helicobacter pylori infection. Gastritis can also result from excessive alcohol consumption, taking certain medicines, or having too much acid in the stomach.  °SYMPTOMS  °In some cases, there are no symptoms. When symptoms are present, they may include: °· Pain or a burning sensation in the upper abdomen. °· Nausea. °· Vomiting. °· An uncomfortable feeling of fullness after eating. °DIAGNOSIS  °Your caregiver may suspect you have gastritis based on your symptoms and a physical exam. To determine the cause of your gastritis, your caregiver may perform the following: °· Blood or stool tests to check for the H pylori bacterium. °· Gastroscopy. A thin, flexible tube (endoscope) is passed down the esophagus and into the stomach. The endoscope has a light and camera on the end. Your caregiver uses the endoscope to view the inside of the stomach. °· Taking a tissue sample (biopsy)   from the stomach to examine under a microscope. °TREATMENT  °Depending on the cause of your gastritis, medicines may be prescribed. If you have a bacterial infection, such as an H pylori infection, antibiotics may be given. If your gastritis is caused by too much acid in the stomach, H2 blockers or antacids may be given. Your caregiver may  recommend that you stop taking aspirin, ibuprofen, or other nonsteroidal anti-inflammatory drugs (NSAIDs). °HOME CARE INSTRUCTIONS °· Only take over-the-counter or prescription medicines as directed by your caregiver. °· If you were given antibiotic medicines, take them as directed. Finish them even if you start to feel better. °· Drink enough fluids to keep your urine clear or pale yellow. °· Avoid foods and drinks that make your symptoms worse, such as: °¨ Caffeine or alcoholic drinks. °¨ Chocolate. °¨ Peppermint or mint flavorings. °¨ Garlic and onions. °¨ Spicy foods. °¨ Citrus fruits, such as oranges, lemons, or limes. °¨ Tomato-based foods such as sauce, chili, salsa, and pizza. °¨ Fried and fatty foods. °· Eat small, frequent meals instead of large meals. °SEEK IMMEDIATE MEDICAL CARE IF:  °· You have black or dark red stools. °· You vomit blood or material that looks like coffee grounds. °· You are unable to keep fluids down. °· Your abdominal pain gets worse. °· You have a fever. °· You do not feel better after 1 week. °· You have any other questions or concerns. °MAKE SURE YOU: °· Understand these instructions. °· Will watch your condition. °· Will get help right away if you are not doing well or get worse. °Document Released: 11/15/2001 Document Revised: 05/22/2012 Document Reviewed: 01/04/2012 °ExitCare® Patient Information ©2015 [employer]. This information is not intended to replace advice given to you by your health care provider. Make sure you discuss any questions you have with your health care provider. ° °

## 2015-07-15 NOTE — ED Notes (Signed)
Pt is c/o upper abd pain that radiates into his back  Pt states pain started yesterday after eating a cheeseburger from [employer]  Pt states he had an episode of vomiting at that time  Pt states he saw blood in his emesis  Pt states yesterday he felt weak all over  Pt states today he has pain when he stands up or coughs  Pt continues to have nausea today

## 2015-07-16 ENCOUNTER — Encounter: Payer: Self-pay | Admitting: Nurse Practitioner

## 2015-07-17 LAB — URINE CULTURE
Culture: NO GROWTH
Special Requests: NORMAL

## 2015-07-28 ENCOUNTER — Ambulatory Visit: Payer: Self-pay | Admitting: Nurse Practitioner

## 2016-01-02 IMAGING — CR DG CHEST 2V
2 series · 2 of 2 positions shown · non-contrast
Comparison: None.

CLINICAL DATA: Chest pain

EXAM:
CHEST  2 VIEW

[w chest pa]
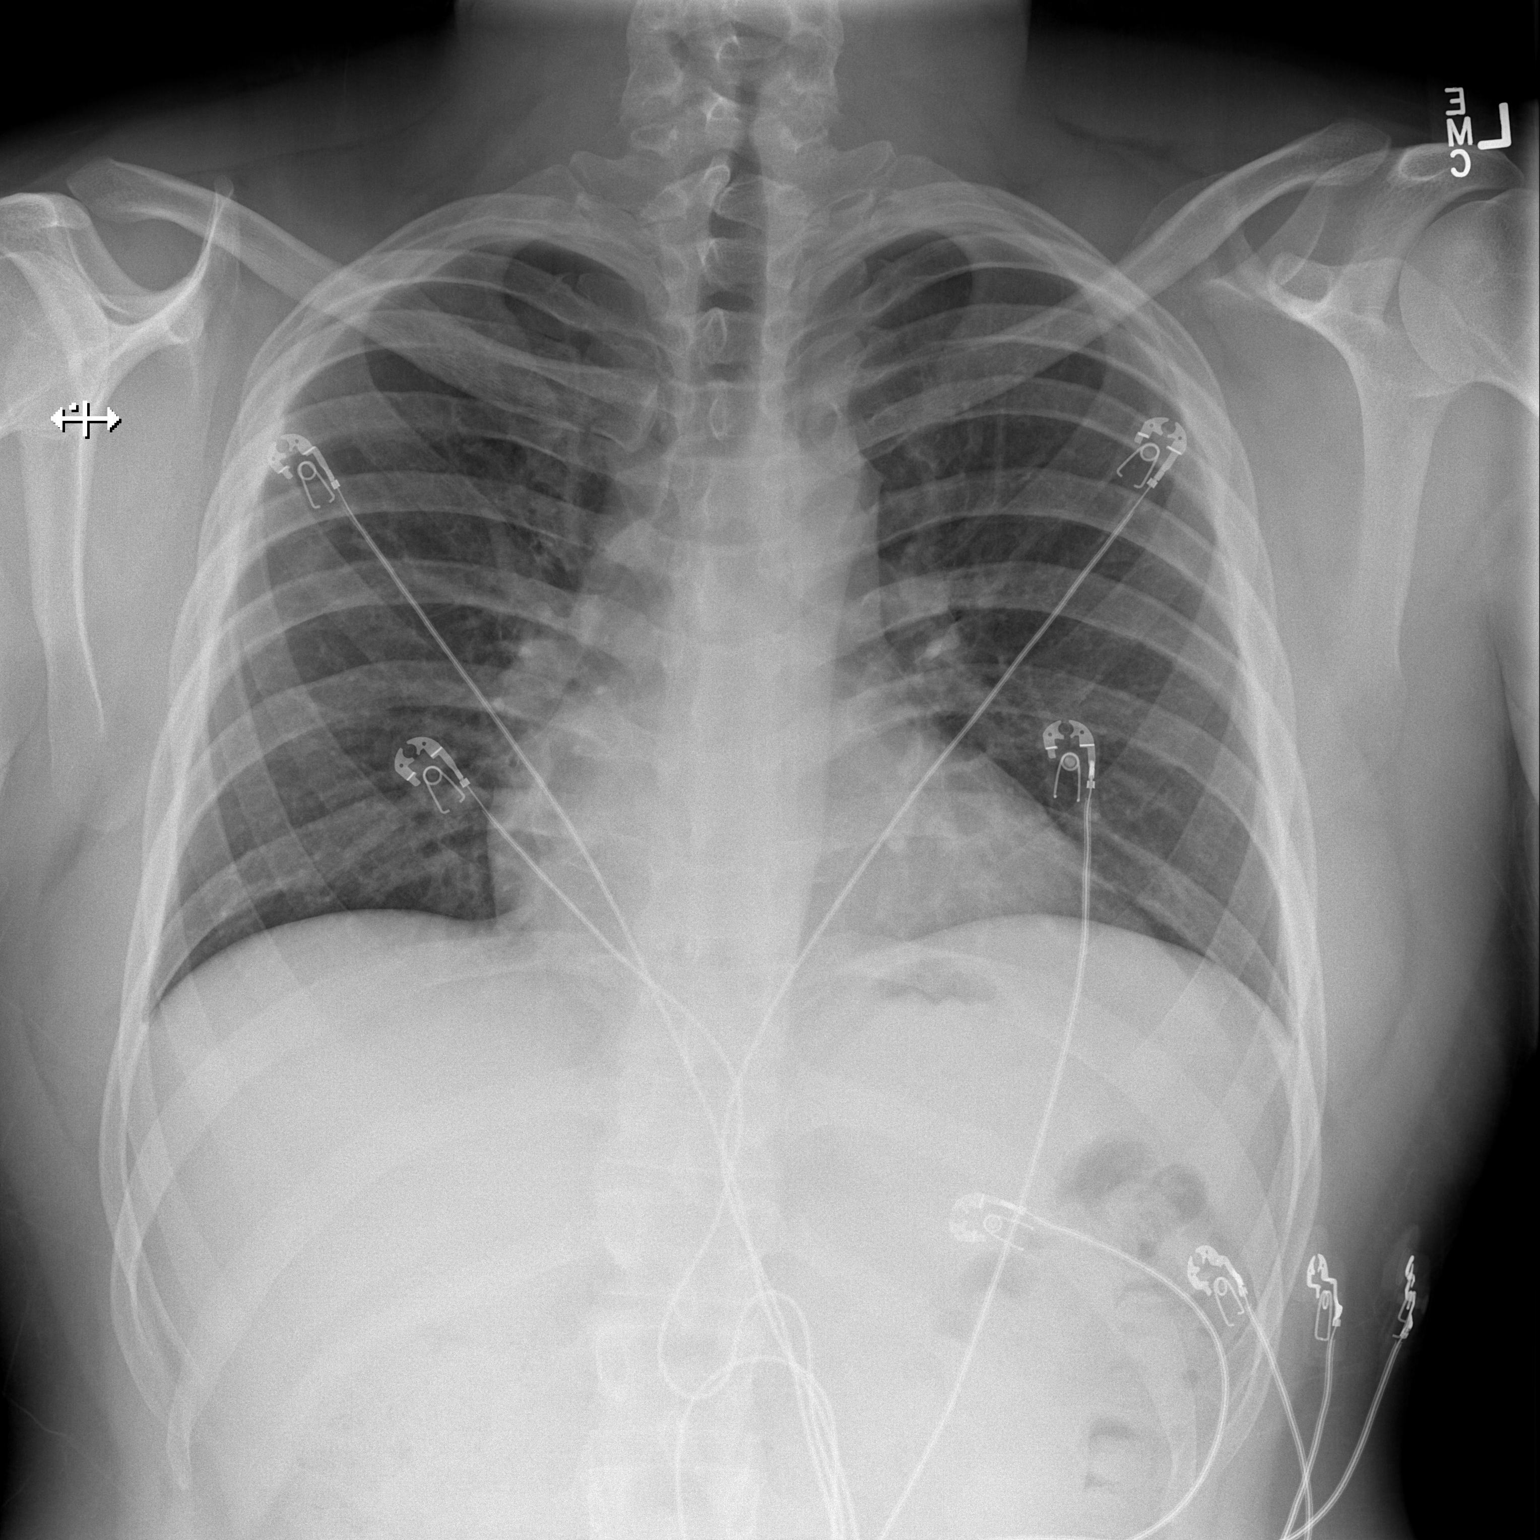

[w chest lat]
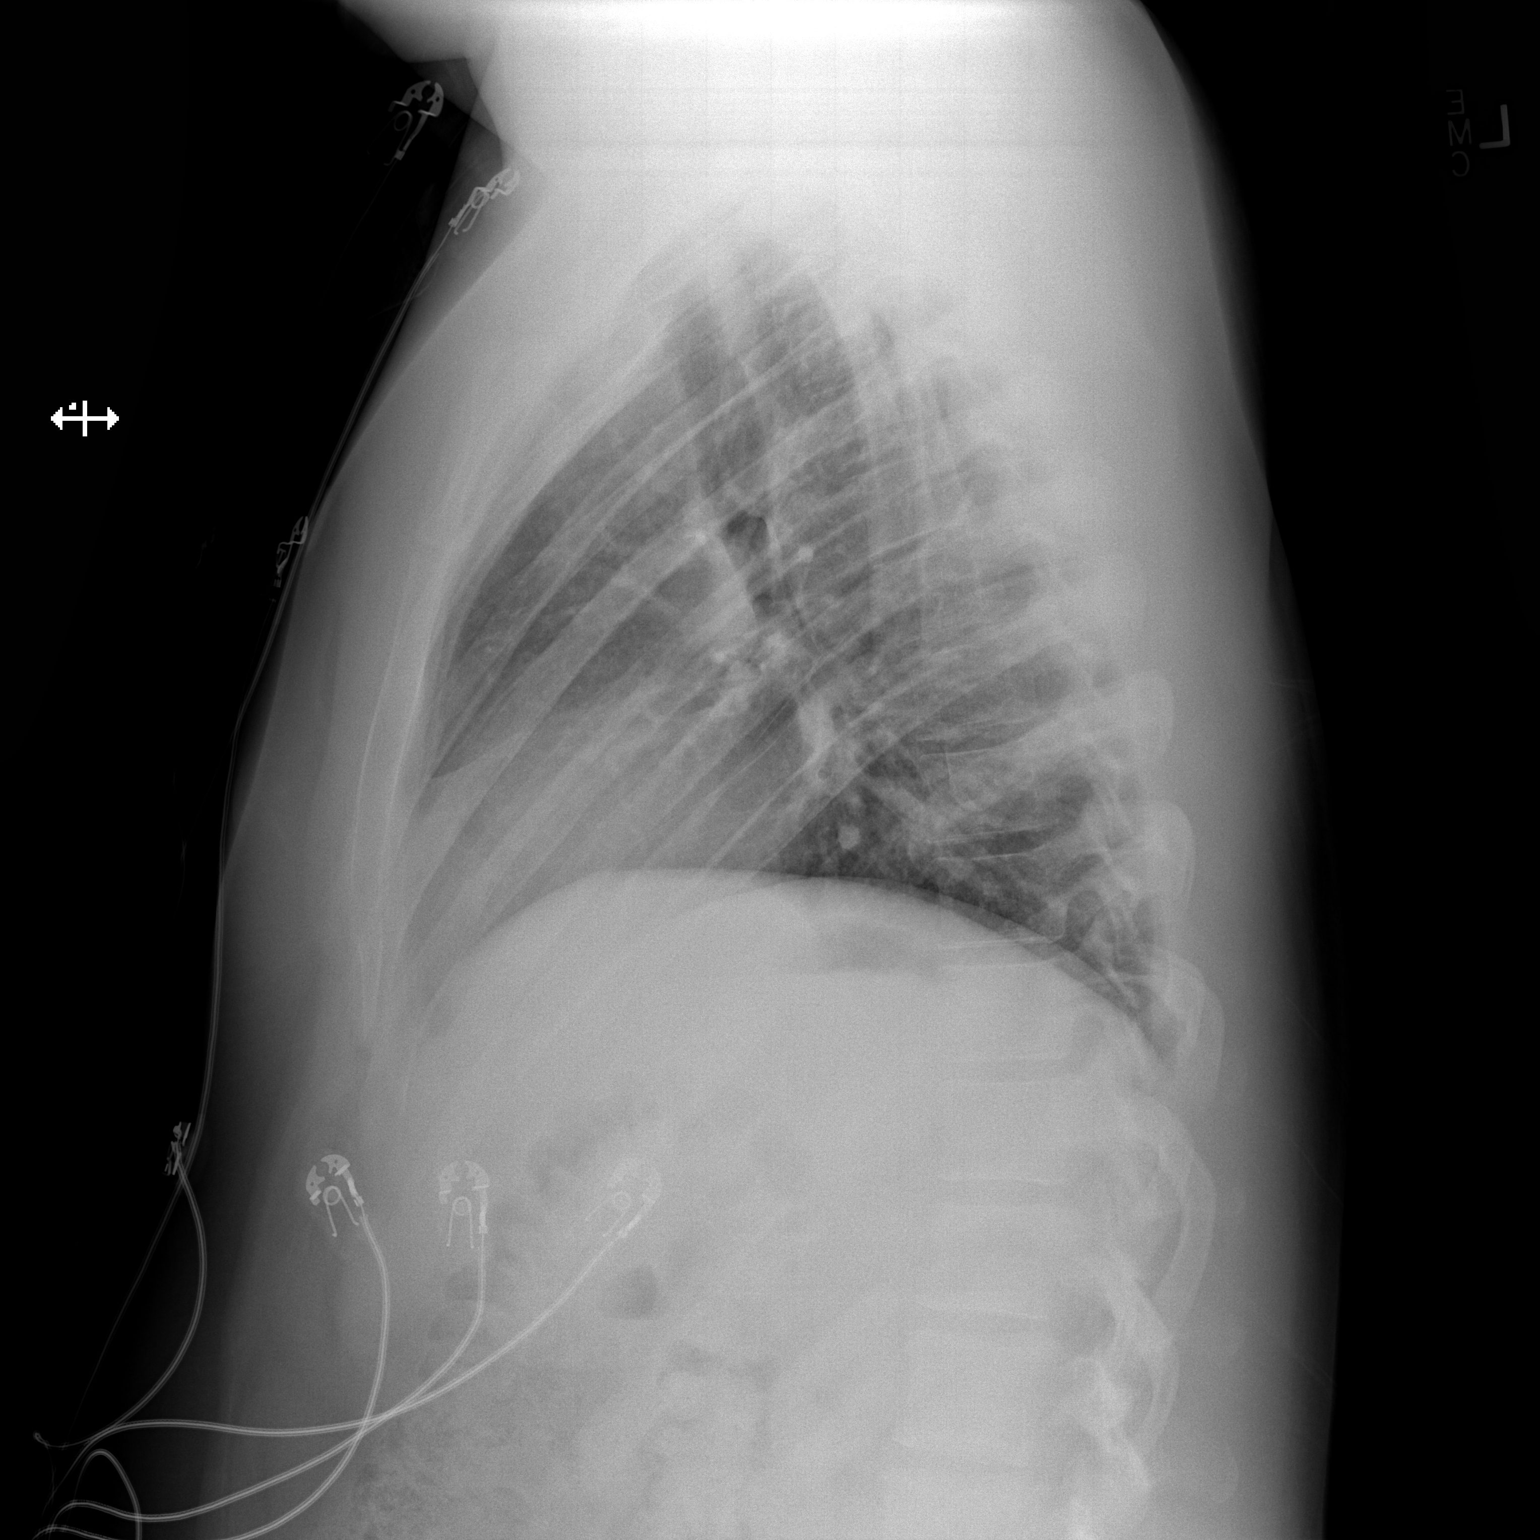

[2 of 2 positions shown; findings below may reference images not displayed]

FINDINGS: Normal heart size and mediastinal contours. No acute infiltrate or
edema. No effusion or pneumothorax. No acute osseous findings.
IMPRESSION: Negative chest.

## 2016-01-16 IMAGING — CT CT ABD-PELV W/ CM
2 of 4 series · 16 of 46 positions shown, 18 images · IV contrast (100 ML OMNI 300)
Comparison: None.

CLINICAL DATA: 24-year-old male with abdominal pain and lower back
pain.

EXAM:
CT ABDOMEN AND PELVIS WITH CONTRAST
TECHNIQUE: Multidetector CT imaging of the abdomen and pelvis was performed
using the standard protocol following bolus administration of
intravenous contrast.
CONTRAST:  100mL OMNIPAQUE IOHEXOL 300 MG/ML  SOLN

[Series 2: abd/pel with · axial · 0.72mm/px · z∈[+1159,+1579]mm · 13 of 95 slices shown, 15 images]
[im 7/95  soft-tissue]
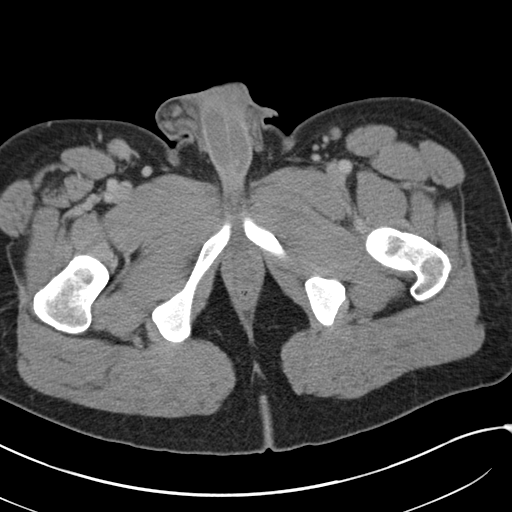
[im 7/95  bone]
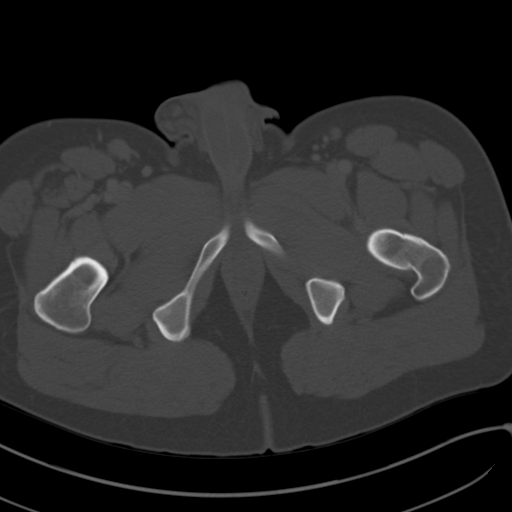
[im 14/95  soft-tissue]
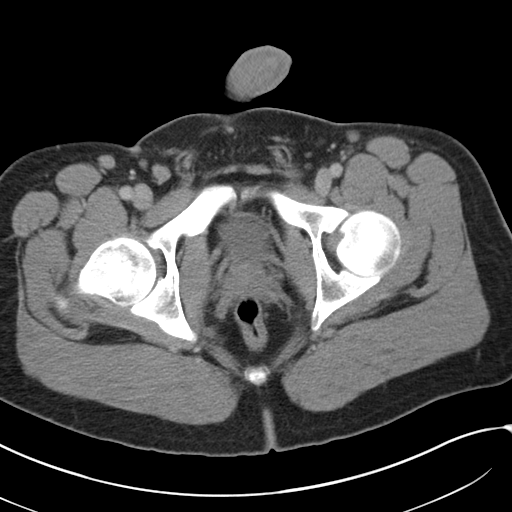
[im 21/95  soft-tissue]
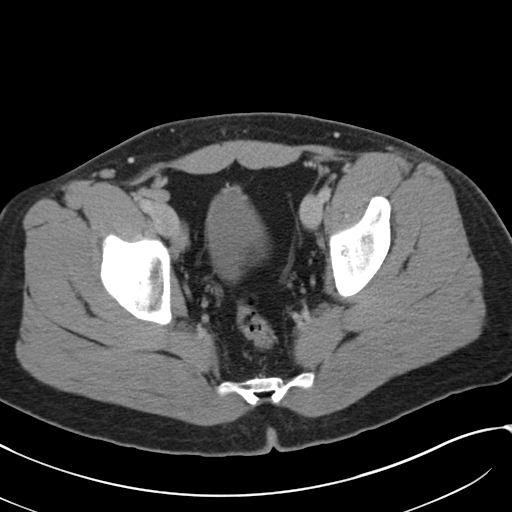
[im 28/95  soft-tissue]
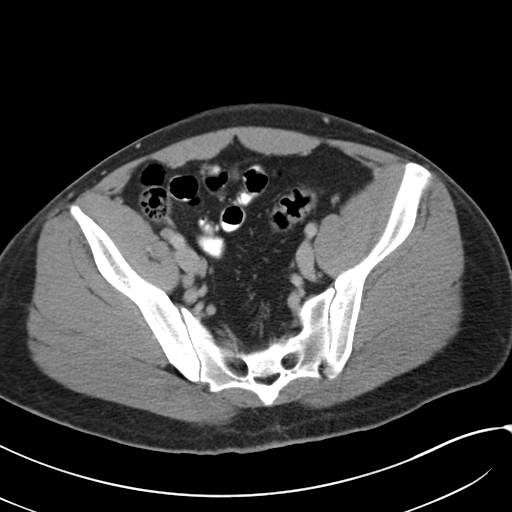
[im 35/95  soft-tissue]
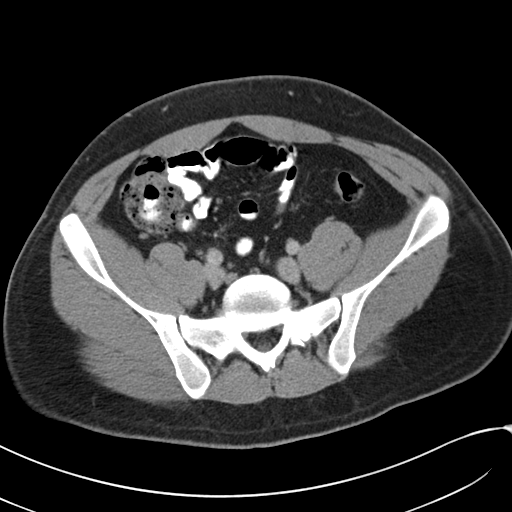
[im 42/95  soft-tissue]
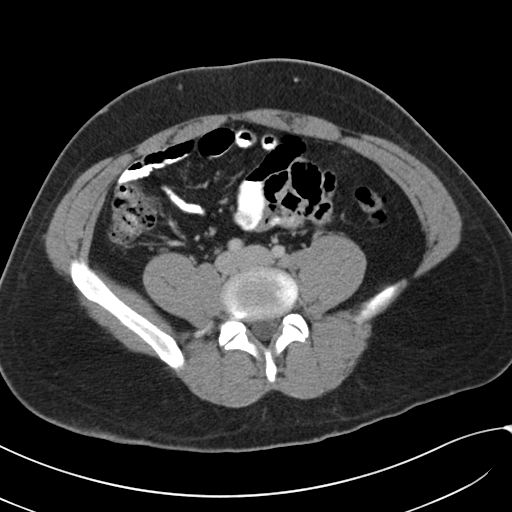
[im 49/95  soft-tissue]
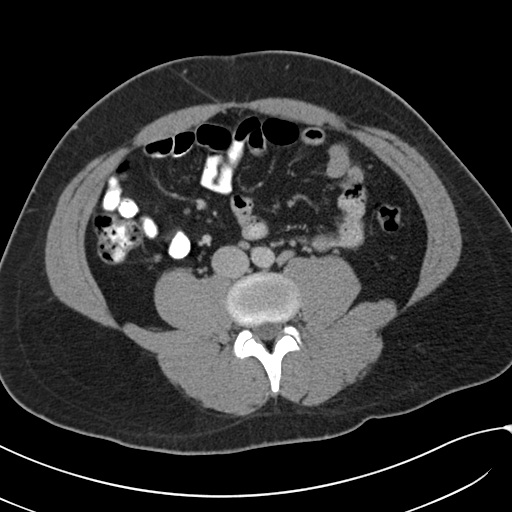
[im 56/95  soft-tissue]
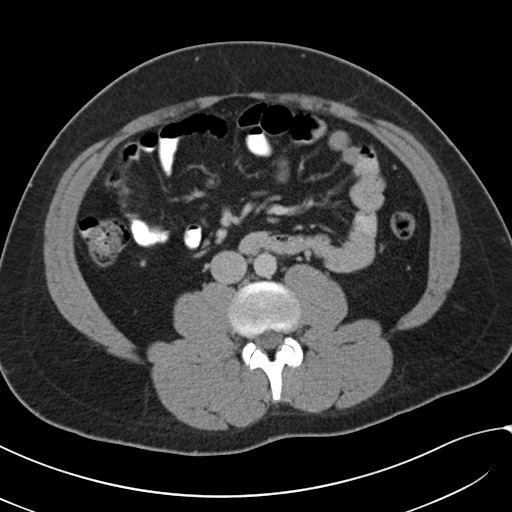
[im 63/95  soft-tissue]
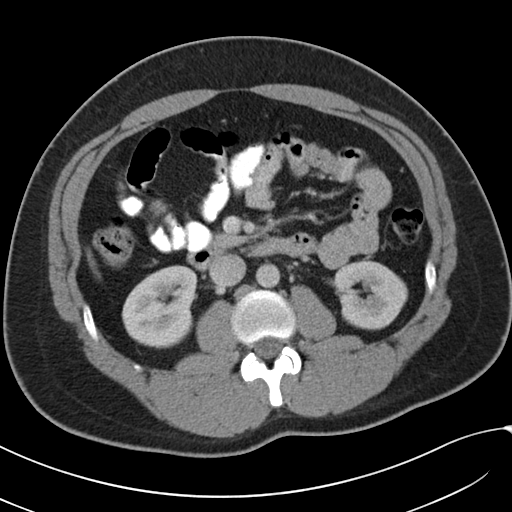
[im 63/95  bone]
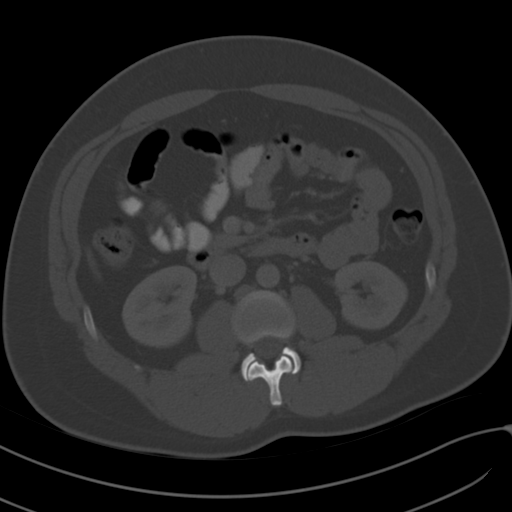
[im 70/95  soft-tissue]
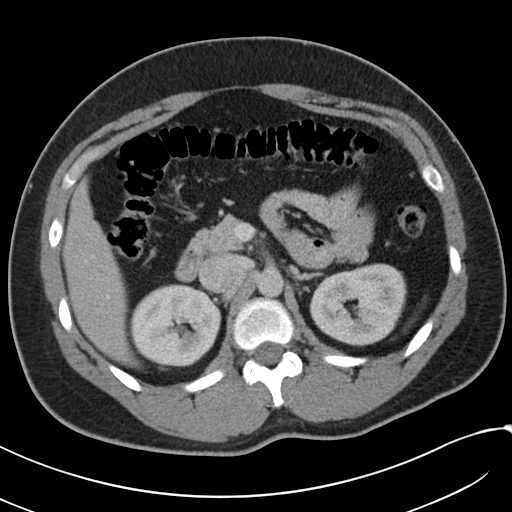
[im 77/95  soft-tissue]
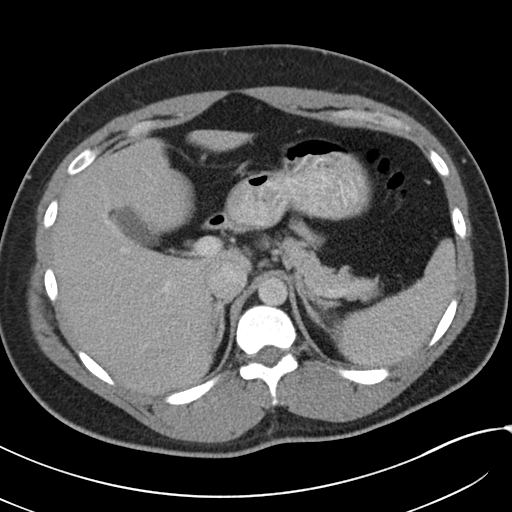
[im 84/95  soft-tissue]
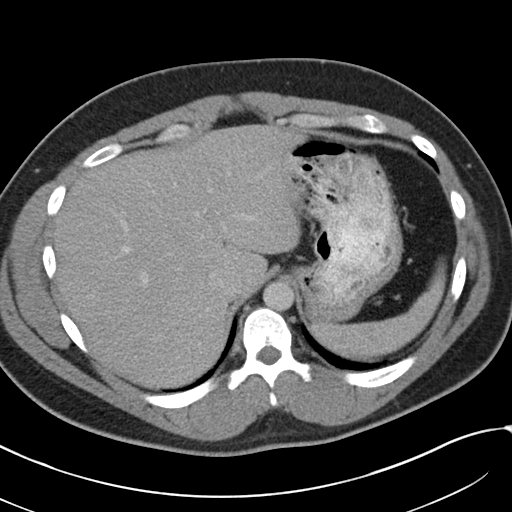
[im 91/95  soft-tissue]
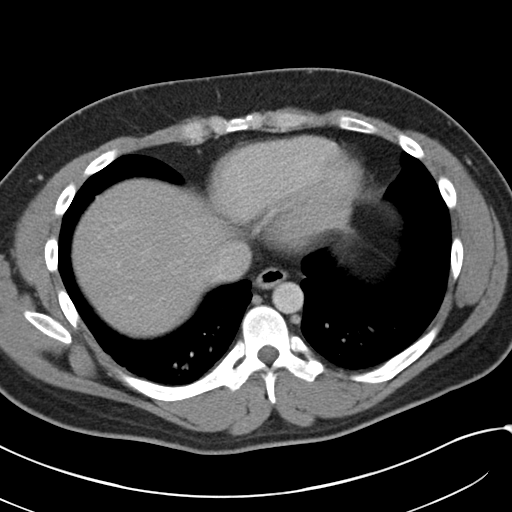

[Series 4: coronal a/|p · coronal · 0.71mm/px · 3 of 85 slices shown]
[im 29/85  soft-tissue]
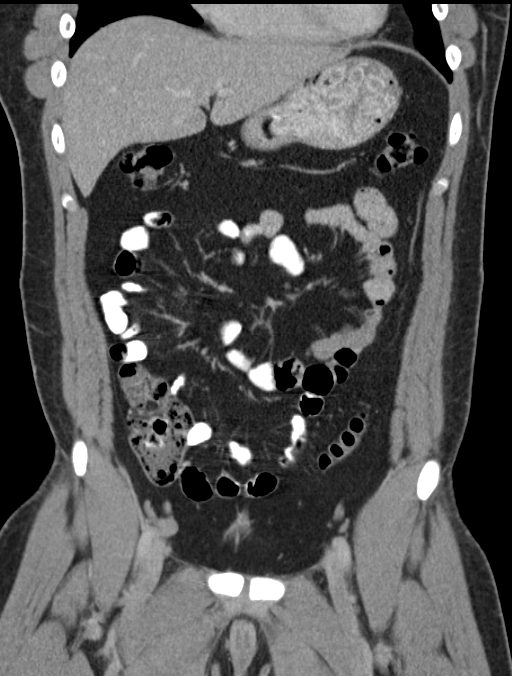
[im 38/85  soft-tissue]
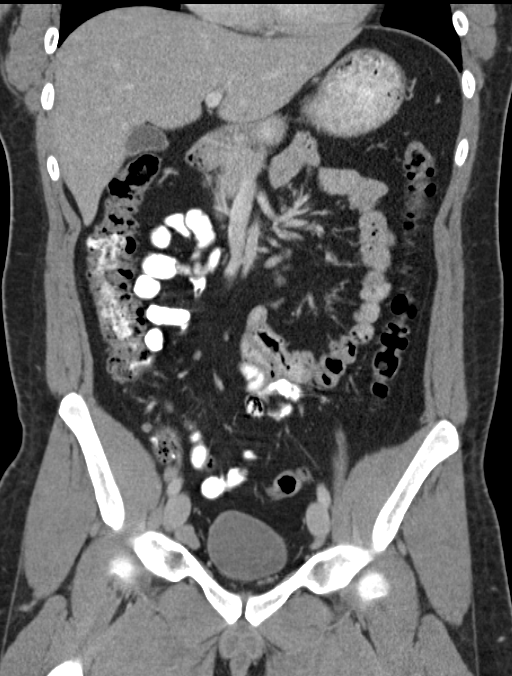
[im 47/85  soft-tissue]
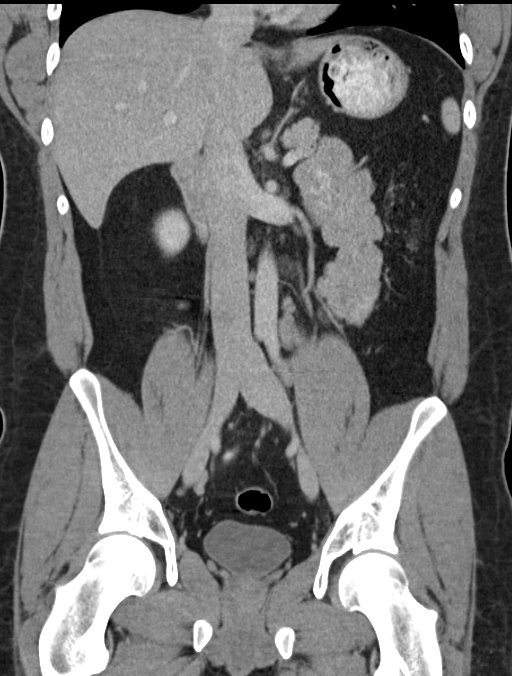

[16 of 46 positions shown; findings below may reference images not displayed]

FINDINGS: The visualized lung bases are clear. No intra-abdominal free air or
free fluid.

The liver, gallbladder, pancreas, spleen, adrenal glands, kidneys,
visualized ureters, and urinary bladder appear unremarkable. The
prostate and seminal vesicles are grossly unremarkable.

There is no evidence of bowel obstruction or inflammation. The
appendix is not visualized and may be surgically absent. No
inflammatory changes noted in the right lower quadrant.

The visualized abdominal aorta and IVC appear patent. No portal
venous gas identified. Top-normal retroperitoneal and para-aortic
lymph nodes, nonspecific. The No adenopathy.

Bone fat containing umbilical hernia. The osseous structures appear
unremarkable.
IMPRESSION: No acute intra-abdominal or pelvic pathology.

## 2016-03-15 IMAGING — US US ABDOMEN LIMITED
1 series · 14 of 25 positions shown · non-contrast
Comparison: CT abdomen and pelvis June 25, 2015

CLINICAL DATA: Upper abdominal pain with nausea and vomiting for 1
day

EXAM:
US ABDOMEN LIMITED - RIGHT UPPER QUADRANT

[Series 1: us abdomen limited · 0.15mm/px · 14 of 44 slices shown]
[im 1/44]
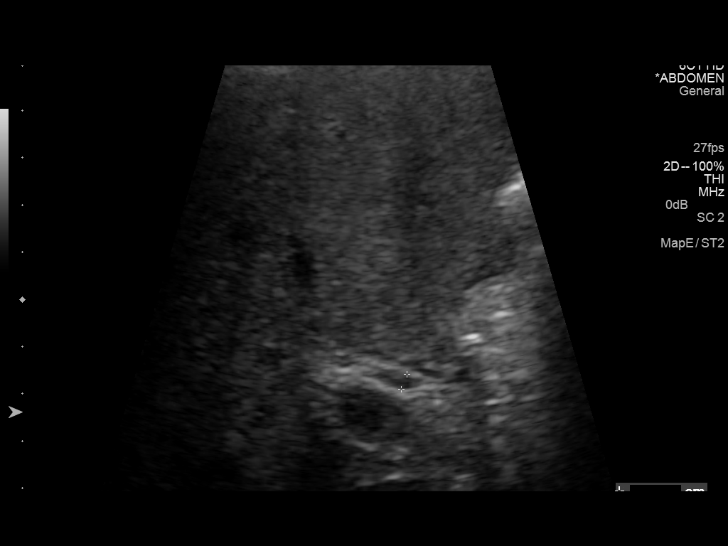
[im 4/44]
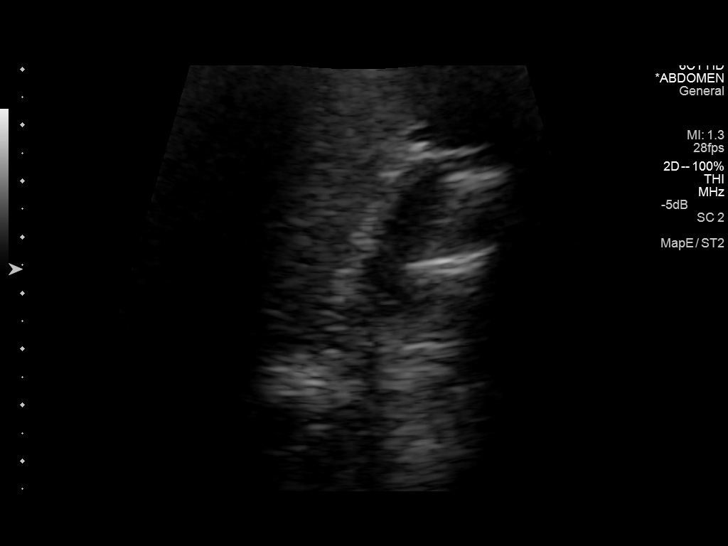
[im 8/44]
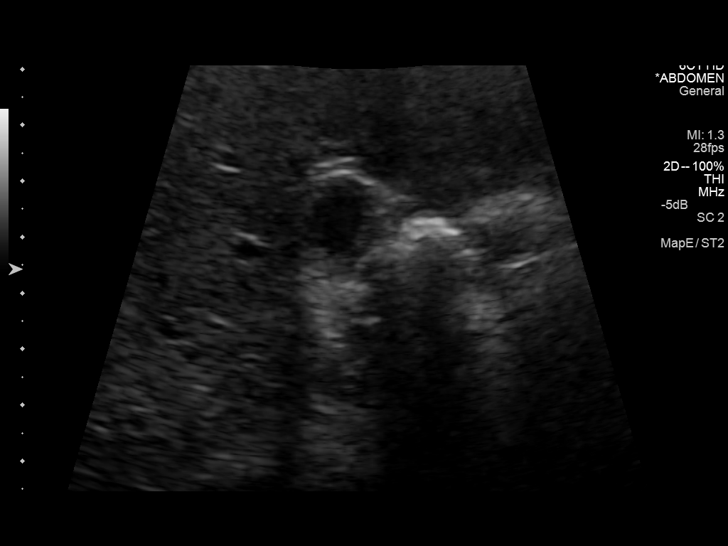
[im 11/44]
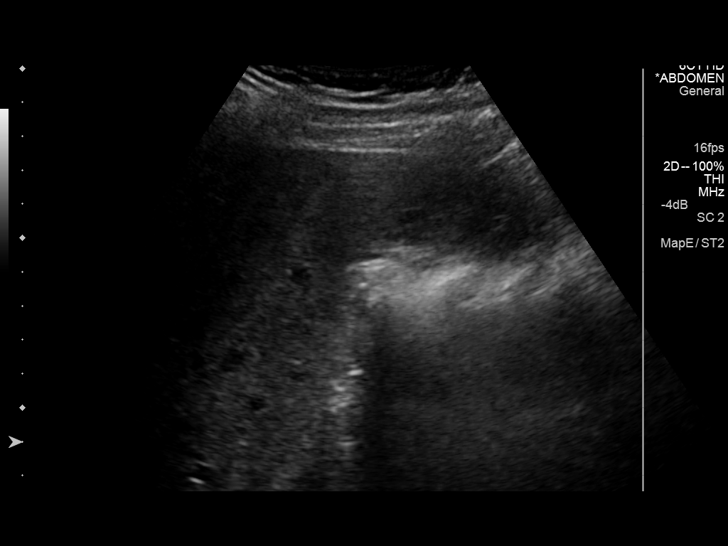
[im 15/44]
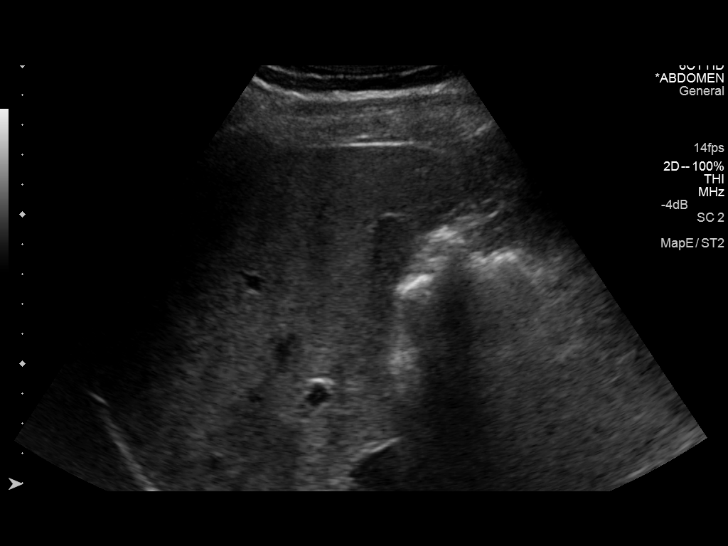
[im 17/44]
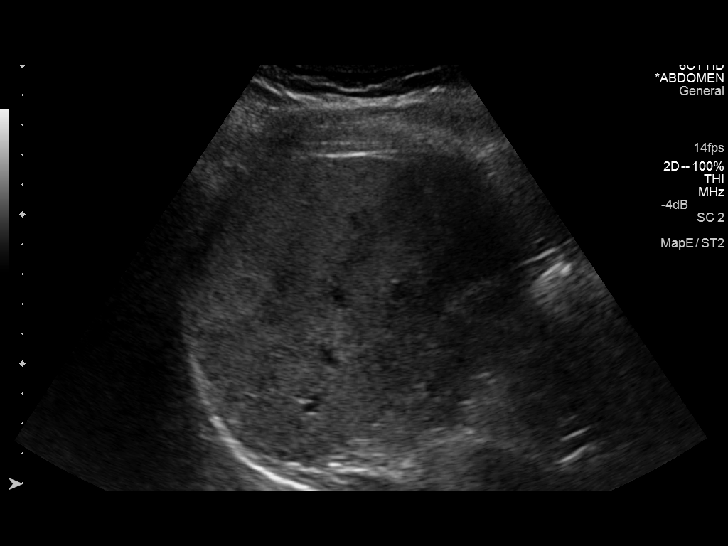
[im 20/44]
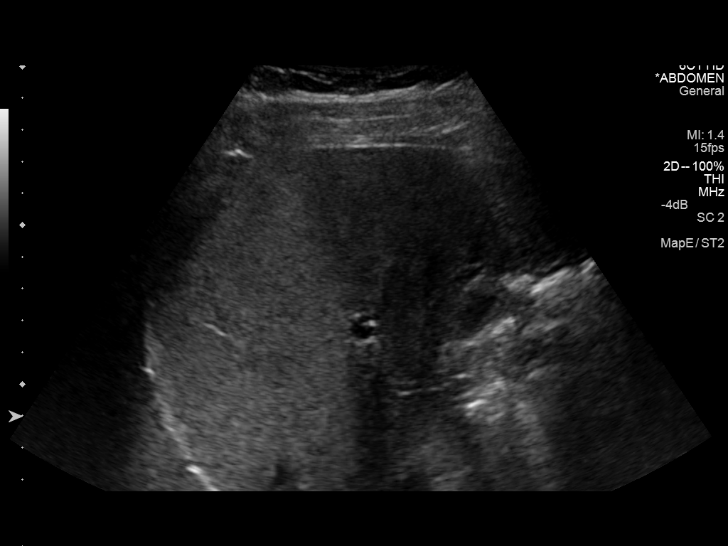
[im 24/44]
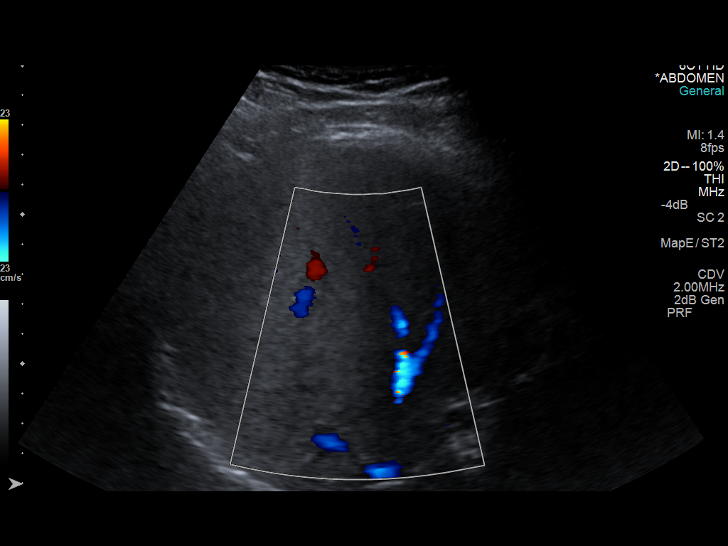
[im 27/44]
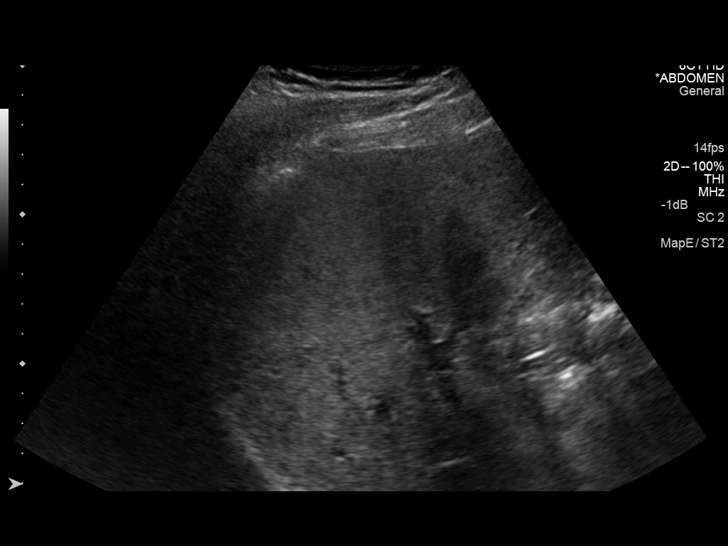
[im 29/44]
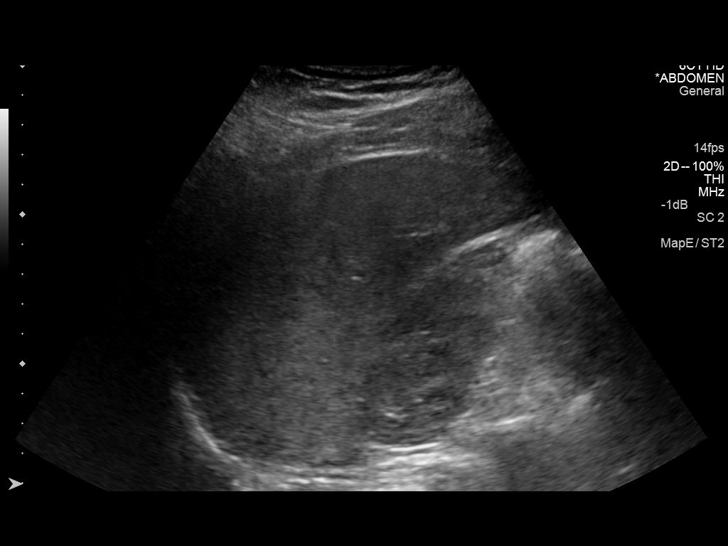
[im 33/44]
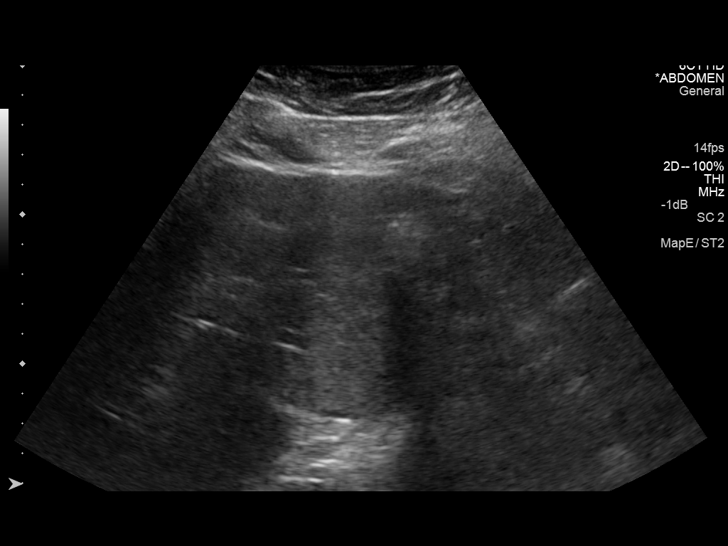
[im 36/44]
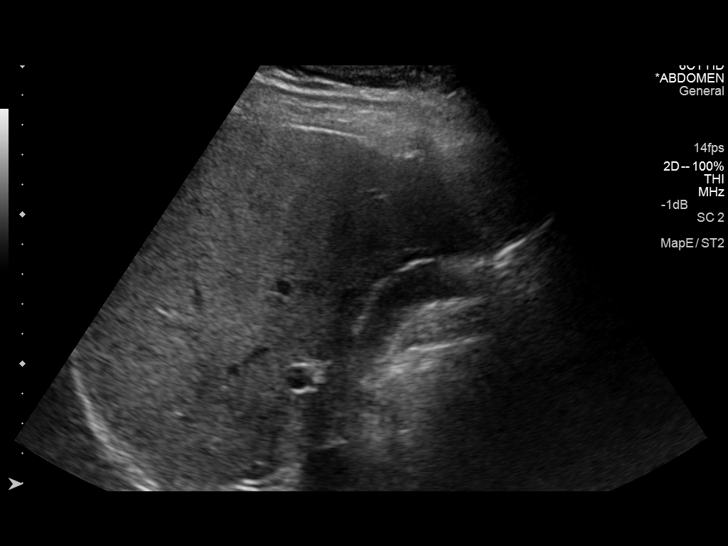
[im 40/44]
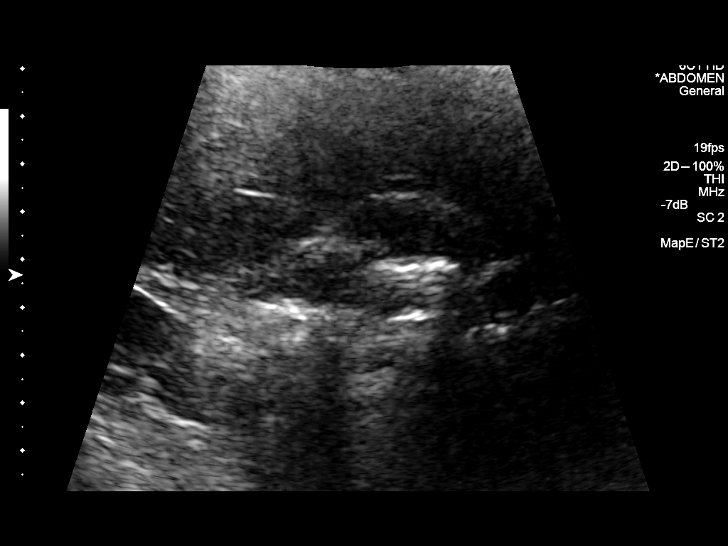
[im 44/44]
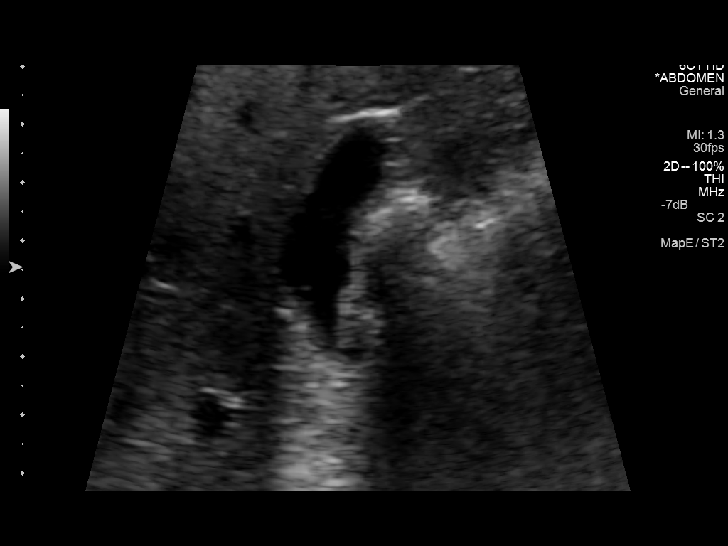

[14 of 25 positions shown; findings below may reference images not displayed]

FINDINGS: Gallbladder:

The gallbladder is contracted consistent with post-prandial state.
No gallstones are seen on this study. There is no gallbladder wall
edema or pericholecystic fluid. No sonographic Murphy sign noted.

Common bile duct:

Diameter: 3 mm. There is no intrahepatic or extrahepatic biliary
duct dilatation.

Liver:

No focal lesion identified.  Liver echogenicity is increased.
IMPRESSION: Gallbladder is somewhat contracted consistent with post-prandial
state. No gallbladder pathology is demonstrated on this study. If
there remains concern for potential gallbladder pathology, repeat
study after minimum of 8 hr fasting would be advised.

Liver echogenicity is increased. This finding most likely is
indicative of a degree of underlying hepatic steatosis. While no
focal liver lesions are identified, it must be cautioned that the
sensitivity of ultrasound for focal liver lesions is diminished in
this circumstance.
